# Patient Record
Sex: Female | Born: 1940 | Race: White | Hispanic: No | Smoking: Never smoker
Health system: Southern US, Community
[De-identification: ages and names within clinical notes are randomized; demographics above are authoritative.]

## PROBLEM LIST (undated history)

## (undated) DIAGNOSIS — K8020 Calculus of gallbladder without cholecystitis without obstruction: Secondary | ICD-10-CM

## (undated) DIAGNOSIS — K449 Diaphragmatic hernia without obstruction or gangrene: Secondary | ICD-10-CM

## (undated) DIAGNOSIS — F32A Depression, unspecified: Secondary | ICD-10-CM

## (undated) DIAGNOSIS — J189 Pneumonia, unspecified organism: Secondary | ICD-10-CM

## (undated) DIAGNOSIS — K649 Unspecified hemorrhoids: Secondary | ICD-10-CM

## (undated) DIAGNOSIS — F419 Anxiety disorder, unspecified: Secondary | ICD-10-CM

## (undated) DIAGNOSIS — F329 Major depressive disorder, single episode, unspecified: Secondary | ICD-10-CM

## (undated) HISTORY — DX: Diaphragmatic hernia without obstruction or gangrene: K44.9

## (undated) HISTORY — PX: OTHER SURGICAL HISTORY: SHX169

## (undated) HISTORY — PX: EYE SURGERY: SHX253

## (undated) HISTORY — DX: Pneumonia, unspecified organism: J18.9

## (undated) HISTORY — DX: Unspecified hemorrhoids: K64.9

## (undated) HISTORY — PX: ABDOMINAL HYSTERECTOMY: SHX81

## (undated) HISTORY — PX: TONSILLECTOMY: SUR1361

## (undated) HISTORY — DX: Calculus of gallbladder without cholecystitis without obstruction: K80.20

---

## 2009-03-30 ENCOUNTER — Encounter: Admission: RE | Admit: 2009-03-30 | Discharge: 2009-03-30 | Payer: Self-pay | Admitting: *Deleted

## 2010-04-11 ENCOUNTER — Ambulatory Visit (HOSPITAL_COMMUNITY)
Admission: RE | Admit: 2010-04-11 | Discharge: 2010-04-11 | Payer: Self-pay | Source: Home / Self Care | Attending: Family Medicine | Admitting: Family Medicine

## 2010-07-21 ENCOUNTER — Encounter: Payer: Self-pay | Admitting: *Deleted

## 2010-07-25 ENCOUNTER — Other Ambulatory Visit: Payer: Self-pay | Admitting: Podiatry

## 2010-07-25 DIAGNOSIS — I739 Peripheral vascular disease, unspecified: Secondary | ICD-10-CM

## 2010-07-26 ENCOUNTER — Encounter: Payer: Self-pay | Admitting: *Deleted

## 2010-07-26 ENCOUNTER — Encounter (INDEPENDENT_AMBULATORY_CARE_PROVIDER_SITE_OTHER): Payer: Medicare Other | Admitting: *Deleted

## 2010-07-26 DIAGNOSIS — I739 Peripheral vascular disease, unspecified: Secondary | ICD-10-CM

## 2010-07-31 ENCOUNTER — Encounter: Payer: Self-pay | Admitting: Podiatry

## 2012-10-21 ENCOUNTER — Emergency Department (INDEPENDENT_AMBULATORY_CARE_PROVIDER_SITE_OTHER)
Admission: EM | Admit: 2012-10-21 | Discharge: 2012-10-21 | Disposition: A | Discharge status: Home / Self Care | Payer: Medicare Other | Source: Home / Self Care | Attending: Emergency Medicine | Admitting: Emergency Medicine

## 2012-10-21 ENCOUNTER — Encounter: Payer: Self-pay | Admitting: *Deleted

## 2012-10-21 ENCOUNTER — Emergency Department (INDEPENDENT_AMBULATORY_CARE_PROVIDER_SITE_OTHER): Payer: Medicare Other

## 2012-10-21 DIAGNOSIS — M79609 Pain in unspecified limb: Secondary | ICD-10-CM

## 2012-10-21 DIAGNOSIS — S92301A Fracture of unspecified metatarsal bone(s), right foot, initial encounter for closed fracture: Secondary | ICD-10-CM

## 2012-10-21 DIAGNOSIS — X58XXXA Exposure to other specified factors, initial encounter: Secondary | ICD-10-CM

## 2012-10-21 DIAGNOSIS — S93336A Other dislocation of unspecified foot, initial encounter: Secondary | ICD-10-CM

## 2012-10-21 DIAGNOSIS — S93326A Dislocation of tarsometatarsal joint of unspecified foot, initial encounter: Secondary | ICD-10-CM

## 2012-10-21 DIAGNOSIS — S93324A Dislocation of tarsometatarsal joint of right foot, initial encounter: Secondary | ICD-10-CM

## 2012-10-21 HISTORY — DX: Major depressive disorder, single episode, unspecified: F32.9

## 2012-10-21 HISTORY — DX: Depression, unspecified: F32.A

## 2012-10-21 HISTORY — DX: Anxiety disorder, unspecified: F41.9

## 2012-10-21 NOTE — ED Notes (Signed)
Pt c/o RT foot injury x 1 hr ago after falling down 2 steps at her home. No OTC meds.

## 2012-10-21 NOTE — ED Provider Notes (Signed)
History    CSN: [id number] Arrival date & time 10/21/12  1247  First MD Initiated Contact with Patient 10/21/12 1259     Chief Complaint  Patient presents with  . Foot Pain   Patient is a 72 y.o. female presenting with lower extremity pain. The history is provided by the patient (And her adult daughter).  Foot Pain This is a new problem. The current episode started less than 1 hour ago. The problem occurs constantly. The problem has been gradually worsening. Pertinent negatives include no chest pain, no abdominal pain, no headaches and no shortness of breath. The symptoms are aggravated by walking. Nothing relieves the symptoms. She has tried nothing for the symptoms. The treatment provided no relief.   About 45 minutes ago, she was at home and accidentally fell down 2 steps to the landing and injured right foot. Right foot immediately became swollen and painful. Unable to weight-bear without severe pain. She denies right ankle pain. Denies numbness or tingling. No syncope or lightheadedness or vertigo or focal neurologic symptoms. Past Medical History  Diagnosis Date  . Anxiety   . Depression    Past Surgical History  Procedure Laterality Date  . Tonsillectomy    . Abdominal hysterectomy    . Nerve removed from rt 4th toe     Family History  Problem Relation Age of Onset  . Heart failure Mother   . Heart failure Father   . Diabetes Sister    History  Substance Use Topics  . Smoking status: Never Smoker   . Smokeless tobacco: Not on file  . Alcohol Use: No   OB History   Grav Para Term Preterm Abortions TAB SAB Ect Mult Living                 Review of Systems  Constitutional: Negative for fever and chills.  HENT: Negative.   Eyes: Negative.   Respiratory: Negative.  Negative for shortness of breath.   Cardiovascular: Negative.  Negative for chest pain.  Gastrointestinal: Negative.  Negative for abdominal pain.  Endocrine: Negative.   Genitourinary: Negative.    Musculoskeletal: Negative for back pain.  Skin: Negative for rash.  Neurological: Negative for headaches.  All other systems reviewed and are negative.    Allergies  Codeine and Other  Home Medications   Current Outpatient Rx  Name  Route  Sig  Dispense  Refill  . estradiol (ESTRACE) 0.5 MG tablet   Oral   Take 0.5 mg by mouth daily.         Marland Kitchen PARoxetine (PAXIL) 10 MG tablet   Oral   Take 10 mg by mouth every morning.         . terbinafine (LAMISIL) 250 MG tablet   Oral   Take 250 mg by mouth daily.          BP 158/86  Pulse 83  Temp(Src) 98.4 F (36.9 C) (Oral)  Resp 18  SpO2 98% Physical Exam  Nursing note and vitals reviewed. Constitutional: She is oriented to person, place, and time. She appears well-developed and well-nourished. No distress.  Sitting in a chair, in much pain from right foot pain. Alert, cooperative. No cardiorespiratory distress   HENT:  Head: Normocephalic and atraumatic.  Eyes: Conjunctivae and EOM are normal. Pupils are equal, round, and reactive to light. No scleral icterus.  Neck: Normal range of motion.  Cardiovascular: Normal rate.   Pulmonary/Chest: Effort normal.  Abdominal: She exhibits no distension.  Musculoskeletal:  Right ankle: Normal. She exhibits no deformity. No tenderness. Achilles tendon normal.       Right foot: She exhibits decreased range of motion, tenderness (Along entire midfoot and forefoot.--No tenderness along the fifth metatarsal.), bony tenderness, swelling and deformity. She exhibits normal capillary refill and no laceration.  Neurological: She is alert and oriented to person, place, and time.  Skin: Skin is warm.  Psychiatric: She has a normal mood and affect.   Peripheral neurovascular intact. Capillary refill intact distally. DP pulse normal. ED Course  Procedures (including critical care time) Labs Reviewed - No data to display Dg Foot Complete Right  10/21/2012   *RADIOLOGY REPORT*   Clinical Data: Trauma this morning with pain at base of great toe and anterior foot.  RIGHT FOOT COMPLETE - 3+ VIEW  Comparison: None.  Findings: There is superior subluxation to dislocation of the first metatarsal relative to the cuneiforms.  There is also lateral displacement of the first through third metatarsas relative to the appropriate cuneiforms.  Irregularity at the base of the first metatarsal is suspicious for fracture.  Mild hallux valgus deformity.  IMPRESSION: Dislocation at the Lisfranc joint.  Possible concurrent fracture of the first metatarsal base.  Further evaluation with CT should be considered.  This study was made a "call report".   Original Report Authenticated By: Jeronimo Greaves, M.D.   1. Lisfranc's dislocation, right, initial encounter   2. Fracture of metatarsal bone, closed, right, initial encounter     MDM  Explained to patient and her adult daughter the seriousness of this dislocation of right forefoot, significant Lisfranc's dislocation. And possible fracture first right metatarsal. After risks, benefits, alternatives discussed, patient and daughter agree with the following plan: Ace bandage applied for temporary purposes.--Neurovascular again rechecked and was intact. We made a copy of x-rays on a disc and gave that to patient and daughter. Daughter will drive patient now to [facility] emergency room (emergency room of choice made by patient and daughter). Our staff assisted the patient to her car via wheelchair, and we carefully avoided her doing any weightbearing on her right foot. I called ahead to the triage nurse at [facility] emergency room and explained the situation and gave details. EDP will evaluate and treat .--I advised that she will likely need stat orthopedic consultation for definitive management.  Lajean Manes, MD 10/21/12 [phone]

## 2012-10-24 ENCOUNTER — Telehealth: Payer: Self-pay | Admitting: *Deleted

## 2013-01-29 ENCOUNTER — Encounter: Payer: Self-pay | Admitting: Internal Medicine

## 2013-02-03 ENCOUNTER — Ambulatory Visit: Payer: BC Managed Care – HMO | Admitting: Internal Medicine

## 2013-04-14 ENCOUNTER — Encounter: Payer: Self-pay | Admitting: Internal Medicine

## 2013-06-02 ENCOUNTER — Ambulatory Visit: Payer: BC Managed Care – HMO | Admitting: Internal Medicine

## 2013-07-08 ENCOUNTER — Encounter: Payer: Self-pay | Admitting: Internal Medicine

## 2013-07-30 ENCOUNTER — Encounter: Payer: Self-pay | Admitting: *Deleted

## 2013-08-17 ENCOUNTER — Encounter: Payer: Self-pay | Admitting: Internal Medicine

## 2013-09-08 ENCOUNTER — Ambulatory Visit: Payer: BC Managed Care – HMO | Admitting: Internal Medicine

## 2014-01-05 ENCOUNTER — Telehealth: Payer: Self-pay | Admitting: Internal Medicine

## 2014-01-05 NOTE — Telephone Encounter (Signed)
Left a message for Tammy to call back.

## 2014-01-06 NOTE — Telephone Encounter (Signed)
Left a  Message for  Tammy to call back.

## 2014-01-07 ENCOUNTER — Encounter: Payer: Self-pay | Admitting: Gastroenterology

## 2014-01-07 ENCOUNTER — Ambulatory Visit (INDEPENDENT_AMBULATORY_CARE_PROVIDER_SITE_OTHER): Payer: Medicare Other | Admitting: Gastroenterology

## 2014-01-07 VITALS — BP 132/84 | HR 84 | Ht 63.5 in | Wt 158.0 lb

## 2014-01-07 DIAGNOSIS — Z1211 Encounter for screening for malignant neoplasm of colon: Secondary | ICD-10-CM

## 2014-01-07 DIAGNOSIS — K5909 Other constipation: Secondary | ICD-10-CM | POA: Insufficient documentation

## 2014-01-07 MED ORDER — MOVIPREP 100 G PO SOLR: 1.0000 | Freq: Once | ORAL | Status: DC

## 2014-01-07 NOTE — Telephone Encounter (Signed)
Spoke with patient's daughter Babette Relicammy. Patient has never had a colonoscopy. She went to ED in [place]([facility]) last week for abdominal cramping, vomiting, diarrhea. She had a CT and was told she had colitis. She was given antibiotics. She took the antibiotics but is still having abdominal pain. Also reports patient has hemorrhoids. Asking to be seen before her December appointment. Scheduled with Doug SouJessica Zehr, PA today at 2:30 PM.

## 2014-01-07 NOTE — Progress Notes (Signed)
01/07/2014 Beth Oliver [id number] 27-Sep-1940   HISTORY OF PRESENT ILLNESS:  This is a 73 year old female who is new to our practice. She apparently was scheduled to be seen here previously by Dr. Olevia Perches but had to cancel that appointment in the past. She is requesting to see Dr. Olevia Perches since she wants a female physician. She presents here today with her daughter to discuss her constipation and scheduling a colonoscopy. On September 22 she went to the emergency department in [place] with complaints of vomiting, abdominal pain, and urinary incontinence. She was not having any diarrhea and in fact has been having worsening constipation. She had a CT scan of the abdomen and pelvis with contrast was performed which showed mild thickening of the colonic wall with minimal pericolonic stranding suggestive of colitis with no visualized abscess or diverticulitis. She also had a leukocytosis of 16,000. Otherwise CBC and CMP were unremarkable. She was treated with a course of Cipro, which she completed, but they only gave her 10-500 mg pills to take. She says that since that time her abdominal pain has overall resolved and she still does not have diarrhea. She is no longer having vomiting. The constipation has been her biggest issue and she says that it is getting worse. She is straining to have bowel movements. She has not taken anything to help her move her bowels. 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 Olevia Perches about long-term treatment of those after her colonoscopy.  She denies any rectal bleeding.   Past Medical History  Diagnosis Date  . Anxiety   . Depression   . Hemorrhoids   . Hiatal hernia   . Gallstone   . Pneumonia    Past Surgical History  Procedure Laterality Date  . Tonsillectomy    . Abdominal hysterectomy    . Nerve removed from rt 4th toe Right   . Eye surgery Right    due to cancer    reports that she has never smoked. She has never used smokeless tobacco. She reports that she does not drink alcohol or use illicit drugs. family history includes Colon cancer in her mother; Diabetes in her mother and sister; Heart failure in her father and mother. Allergies  Allergen Reactions  . Codeine   . Other     ABT- pt is unsure of the name  . Oxycodone       Outpatient Encounter Prescriptions as of 01/07/2014  Medication Sig  . azelastine (OPTIVAR) 0.05 % ophthalmic solution Place 1 drop into both eyes 2 (two) times daily.  Marland Kitchen PARoxetine (PAXIL) 40 MG tablet Take 40 mg by mouth every morning.  . [DISCONTINUED] diclofenac (VOLTAREN) 75 MG EC tablet Take 75 mg by mouth 2 (two) times daily.  . [DISCONTINUED] estradiol (ESTRACE) 0.5 MG tablet Take 0.5 mg by mouth daily.  . [DISCONTINUED] montelukast (SINGULAIR) 10 MG tablet Take 10 mg by mouth at bedtime.  . [DISCONTINUED] tolterodine (DETROL LA) 4 MG 24 hr capsule Take 4 mg by mouth daily.  Marland Kitchen MOVIPREP 100 G SOLR Take 1 kit (200 g total) by mouth once.  . [DISCONTINUED] PARoxetine (PAXIL) 10 MG tablet Take 10 mg by mouth every morning.  . [DISCONTINUED] terbinafine (LAMISIL) 250 MG tablet Take 250 mg by mouth daily.     REVIEW OF SYSTEMS  : All other systems reviewed and negative except where noted in  the History of Present Illness.   PHYSICAL EXAM: BP 132/84  Pulse 84  Ht 5' 3.5" (1.613 m)  Wt 158 lb (71.668 kg)  BMI 27.55 kg/m2 General: Well developed white female in no acute distress; appears very anxious Head: Normocephalic and atraumatic Eyes:  Sclerae anicteric, conjunctiva pink. Ears: Normal auditory acuity  Lungs: Clear throughout to auscultation Heart: Regular rate and rhythm Abdomen: Soft, non-distended.  Normal bowel sounds.  Non-tender. Rectal:  Deferred.  Will be done at the time of colonoscopy. Musculoskeletal: Symmetrical with no gross deformities  Skin: No lesions on visible  extremities Extremities: No edema  Neurological: Alert oriented x 4, grossly non-focal Psychological:  Alert and cooperative. Normal mood and affect  ASSESSMENT AND PLAN: -Screening colonoscopy:  Patient never had one in the past.  Will schedule with Dr. Olevia Perches at the patient's request.  The risks, benefits, and alternatives were discussed with the patient and she consents to proceed. -Constipation:  Will start taking Miralax daily.

## 2014-01-07 NOTE — Patient Instructions (Addendum)
You have been scheduled for a colonoscopy. Please follow written instructions given to you at your visit today.  Please pick up your prep kit at the pharmacy within the next 1-3 days. If you use inhalers (even only as needed), please bring them with you on the day of your procedure. Your physician has requested that you go to www.startemmi.com and enter the access code given to you at your visit today. This web site gives a general overview about your procedure. However, you should still follow specific instructions given to you by our office regarding your preparation for the procedure.  Please purchase Miralax over the counter and take one capful and mix in juice or water to drink once daily.

## 2014-01-07 NOTE — Progress Notes (Signed)
Reviewed, I hope she has colonoscopy scheduled soon in case she has a rectal tumor. I would have done a rectal exam to make sure.

## 2014-01-08 ENCOUNTER — Ambulatory Visit: Payer: Medicare Other | Admitting: Gastroenterology

## 2014-01-20 ENCOUNTER — Encounter: Payer: Self-pay | Admitting: Internal Medicine

## 2014-01-20 ENCOUNTER — Ambulatory Visit (AMBULATORY_SURGERY_CENTER): Payer: Medicare Other | Admitting: Internal Medicine

## 2014-01-20 VITALS — BP 144/75 | HR 61 | Temp 98.5°F | Resp 20 | Ht 63.5 in | Wt 158.0 lb

## 2014-01-20 DIAGNOSIS — Z1211 Encounter for screening for malignant neoplasm of colon: Secondary | ICD-10-CM

## 2014-01-20 MED ORDER — SODIUM CHLORIDE 0.9 % IV SOLN: 500.0000 mL | INTRAVENOUS | Status: DC

## 2014-01-20 NOTE — Patient Instructions (Addendum)
YOU HAD AN ENDOSCOPIC PROCEDURE TODAY AT THE [facility]: Refer to the procedure report that was given to you for any specific questions about what was found during the examination.  If the procedure report does not answer your questions, please call your gastroenterologist to clarify.  If you requested that your care partner not be given the details of your procedure findings, then the procedure report has been included in a sealed envelope for you to review at your convenience later.  YOU SHOULD EXPECT: Some feelings of bloating in the abdomen. Passage of more gas than usual.  Walking can help get rid of the air that was put into your GI tract during the procedure and reduce the bloating. If you had a lower endoscopy (such as a colonoscopy or flexible sigmoidoscopy) you may notice spotting of blood in your stool or on the toilet paper. If you underwent a bowel prep for your procedure, then you may not have a normal bowel movement for a few days.  DIET: Your first meal following the procedure should be a light meal and then it is ok to progress to your normal diet.  A half-sandwich or bowl of soup is an example of a good first meal.  Heavy or fried foods are harder to digest and may make you feel nauseous or bloated.  Likewise meals heavy in dairy and vegetables can cause extra gas to form and this can also increase the bloating.  Drink plenty of fluids but you should avoid alcoholic beverages for 24 hours.  ACTIVITY: Your care partner should take you home directly after the procedure.  You should plan to take it easy, moving slowly for the rest of the day.  You can resume normal activity the day after the procedure however you should NOT DRIVE or use heavy machinery for 24 hours (because of the sedation medicines used during the test).    SYMPTOMS TO REPORT IMMEDIATELY: A gastroenterologist can be reached at any hour.  During normal business hours, 8:30 AM to 5:00 PM Monday through Friday,  call [phone].  After hours and on weekends, please call the GI answering service at [phone] who will take a message and have the physician on call contact you.   Following lower endoscopy (colonoscopy or flexible sigmoidoscopy):  Excessive amounts of blood in the stool  Significant tenderness or worsening of abdominal pains  Swelling of the abdomen that is new, acute  Fever of 100F or higher  FOLLOW UP: Our staff will call the home number listed on your records the next business day following your procedure to check on you and address any questions or concerns that you may have at that time regarding the information given to you following your procedure. This is a courtesy call and so if there is no answer at the home number and we have not heard from you through the emergency physician on call, we will assume that you have returned to your regular daily activities without incident.  SIGNATURES/CONFIDENTIALITY: You and/or your care partner have signed paperwork which will be entered into your electronic medical record.  These signatures attest to the fact that that the information above on your After Visit Summary has been reviewed and is understood.  Full responsibility of the confidentiality of this discharge information lies with you and/or your care-partner.  Continue your normal medications; including Miralax 17 grams daily as needed for constipation  Fiber supplements such as Metamucil 1-2 teaspoons daily  Please read over  handouts about hemorrhoids and high fiber diets

## 2014-01-20 NOTE — Progress Notes (Signed)
Report to PACU, RN, vss, BBS= Clear.  

## 2014-01-20 NOTE — Op Note (Addendum)
[facility] [address] [place], [id number]   COLONOSCOPY PROCEDURE REPORT  PATIENT: Beth Oliver, Beth Oliver  MR#: [id number] BIRTHDATE: 1940-10-14 , 73  yrs. old GENDER: female ENDOSCOPIST: Hart Carwinora M Shamanda Len, MD REFERRED HQ:IONGBY:none PROCEDURE DATE:  01/20/2014 PROCEDURE:   Colonoscopy, screening First Screening Colonoscopy - Avg.  risk and is 50 yrs.  old or older Yes.  Prior Negative Screening - Now for repeat screening. N/A  History of Adenoma - Now for follow-up colonoscopy & has been > or = to 3 yrs.  N/A  Polyps Removed Today? No. ASA CLASS:   Class II INDICATIONS:      increased risk for colon cancer ( mother had rectal cancer)  . constipationv MEDICATIONS: Monitored anesthesia care and Propofol 230 mg IV  DESCRIPTION OF PROCEDURE:   After the risks benefits and alternatives of the procedure were thoroughly explained, informed consent was obtained.  The digital rectal exam revealed no abnormalities of the rectum.   The LB 1528 and LB [id number] endoscope was introduced through the anus and advanced to the cecum, which was identified by both the appendix and ileocecal valve. No adverse events experienced.   The quality of the prep was good, using MoviPrep  The instrument was then slowly withdrawn as the colon was fully examined.      COLON FINDINGS: A normal appearing cecum, ileocecal valve, and appendiceal orifice were identified.  The ascending, transverse, descending, sigmoid colon, and rectum appeared unremarkable. Small internal Grade I hemorrhoids were found.  Retroflexed views revealed no abnormalities. The time to cecum=5 minutes 31 seconds. Withdrawal time=8 minutes 02 seconds.  The scope was withdrawn and the procedure completed. COMPLICATIONS: There were no immediate complications.  ENDOSCOPIC IMPRESSION: 1.   Normal colonoscopy 2.   Small internal Grade I hemorrhoids  RECOMMENDATIONS :    [R  high fiber diet Continue MiraLax 9-17 g  daily no recall due to age  eSigned:  Hart CarwinDora M Ricard Faulkner, MD 01/20/2014 1:02 PM Revised: 01/20/2014 1:02 PM  cc:

## 2014-01-21 ENCOUNTER — Telehealth: Payer: Self-pay | Admitting: *Deleted

## 2014-01-21 NOTE — Telephone Encounter (Signed)
  Follow up Call-  Call back number 01/20/2014  Post procedure Call Back phone  # [phone]  Permission to leave phone message Yes     Patient questions:  Someone answered the phone but hung up on me.

## 2014-03-12 ENCOUNTER — Ambulatory Visit: Payer: BC Managed Care – HMO | Admitting: Internal Medicine

## 2014-06-01 IMAGING — CR DG FOOT COMPLETE 3+V*R*
3 series · 3 of 3 positions shown · non-contrast
Comparison: None.

CLINICAL DATA: Trauma this morning with pain at base of great toe
and anterior foot.

RIGHT FOOT COMPLETE - 3+ VIEW

[view not recorded (1 of 3)]
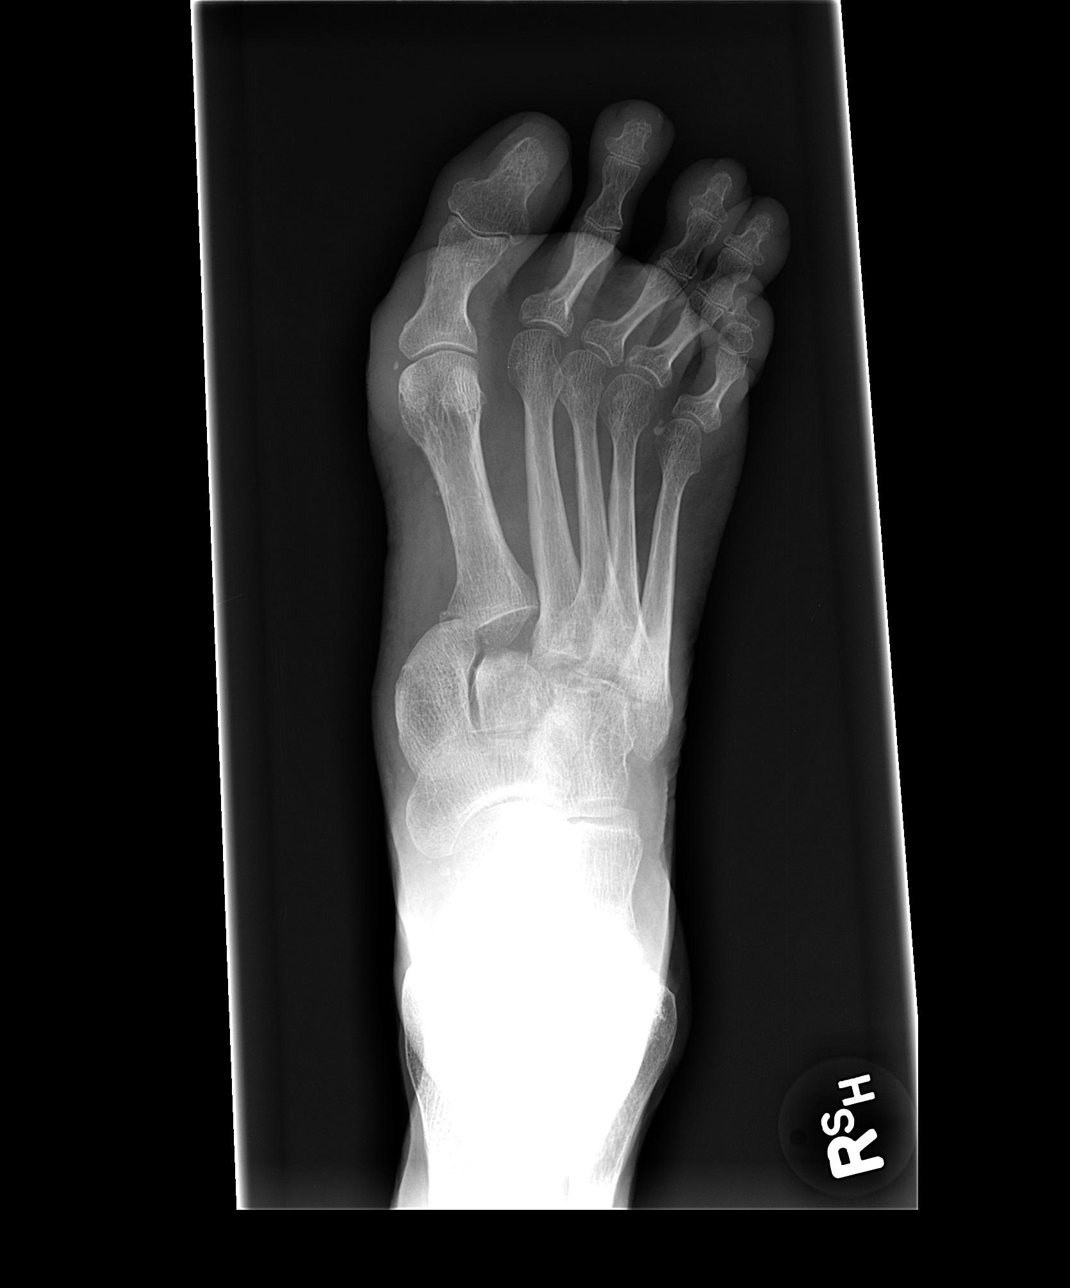

[view not recorded (2 of 3)]
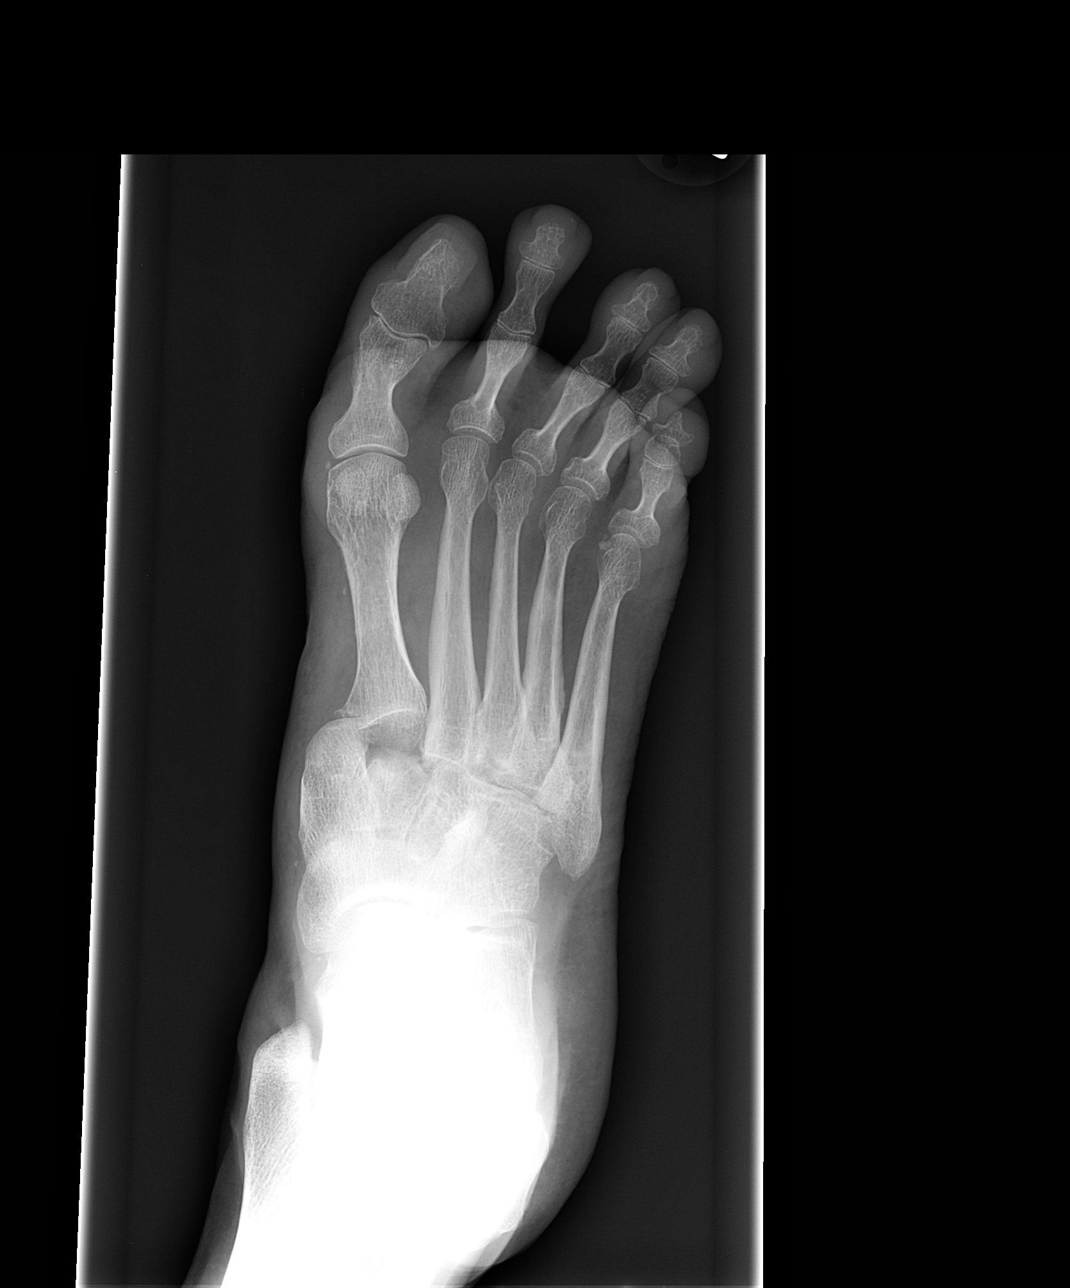

[view not recorded (3 of 3)]
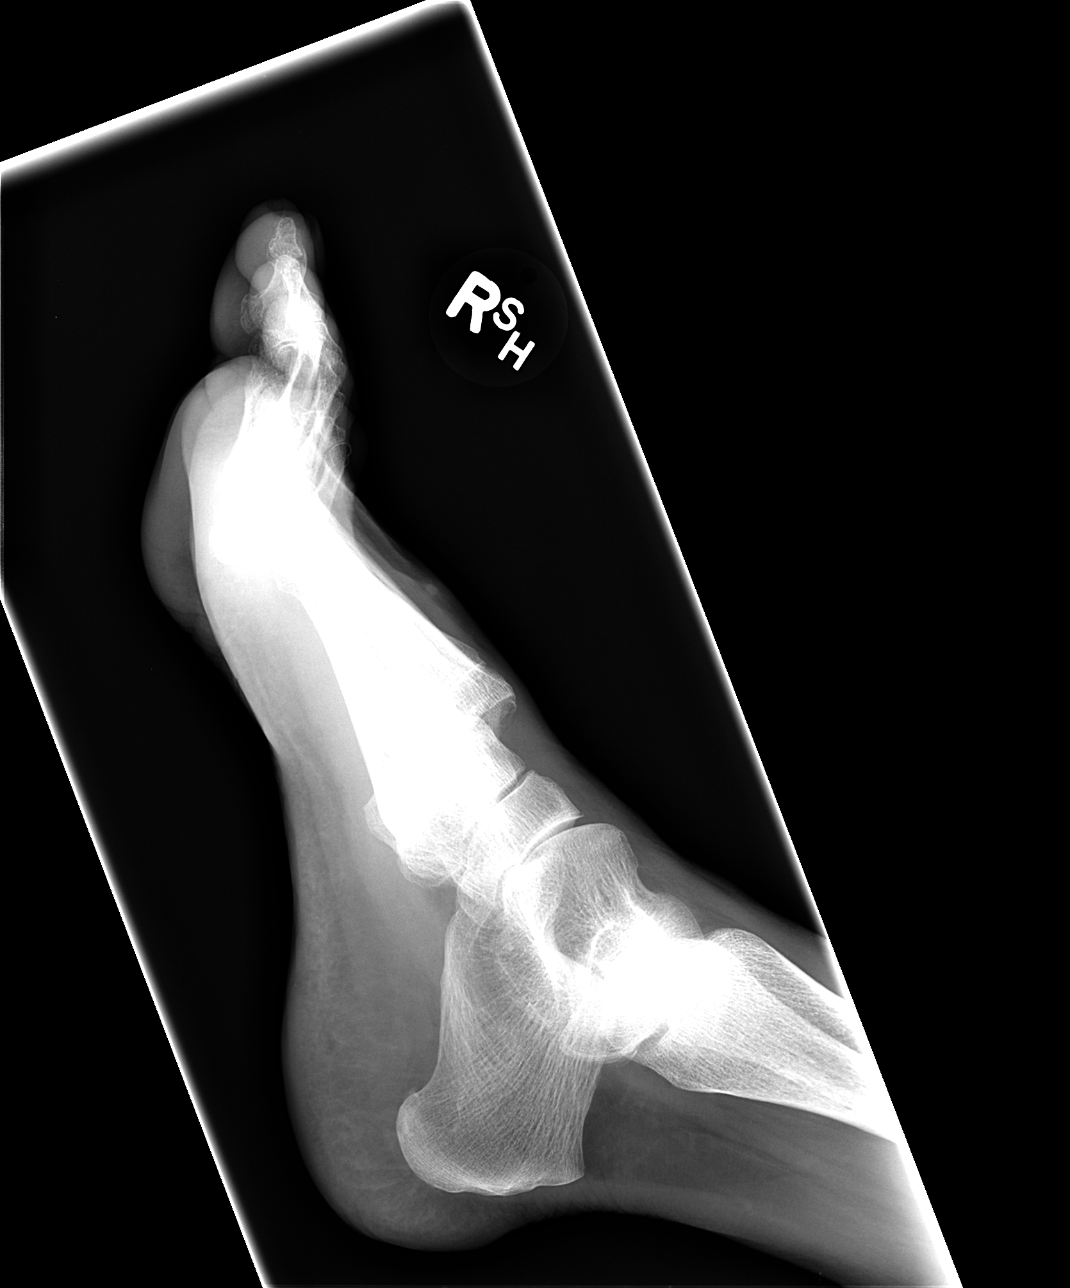

[3 of 3 positions shown; findings below may reference images not displayed]

FINDINGS: There is superior subluxation to dislocation of the first
metatarsal relative to the cuneiforms.  There is also lateral
displacement of the first through third metatarsas relative to the
appropriate cuneiforms.  Irregularity at the base of the first
metatarsal is suspicious for fracture.

Mild hallux valgus deformity.
IMPRESSION: Dislocation at the Lisfranc joint.  Possible concurrent fracture of
the first metatarsal base.  Further evaluation with CT should be
considered.

This study was made a "call report".

## 2018-03-02 DEATH — deceased
